# Patient Record
Sex: Female | Born: 1935 | Race: White | Hispanic: No | Smoking: Never smoker
Health system: Southern US, Community
[De-identification: ages and names within clinical notes are randomized; demographics above are authoritative.]

## PROBLEM LIST (undated history)

## (undated) DIAGNOSIS — K449 Diaphragmatic hernia without obstruction or gangrene: Secondary | ICD-10-CM

## (undated) DIAGNOSIS — M4800 Spinal stenosis, site unspecified: Secondary | ICD-10-CM

## (undated) DIAGNOSIS — I10 Essential (primary) hypertension: Secondary | ICD-10-CM

## (undated) DIAGNOSIS — S12100A Unspecified displaced fracture of second cervical vertebra, initial encounter for closed fracture: Secondary | ICD-10-CM

## (undated) HISTORY — PX: APPENDECTOMY: SHX54

## (undated) HISTORY — PX: HERNIA REPAIR: SHX51

## (undated) HISTORY — PX: ARTHROPLASTY: SHX135

## (undated) HISTORY — PX: OTHER SURGICAL HISTORY: SHX169

## (undated) HISTORY — PX: CAROTID ENDARTERECTOMY: SUR193

## (undated) HISTORY — PX: TONSILLECTOMY: SUR1361

## (undated) HISTORY — PX: CHOLECYSTECTOMY: SHX55

## (undated) HISTORY — PX: BACK SURGERY: SHX140

---

## 2015-05-05 ENCOUNTER — Encounter (HOSPITAL_COMMUNITY): Payer: Self-pay | Admitting: Emergency Medicine

## 2015-05-05 ENCOUNTER — Emergency Department (HOSPITAL_COMMUNITY): Payer: Medicare Other

## 2015-05-05 ENCOUNTER — Emergency Department (HOSPITAL_COMMUNITY)
Admission: EM | Admit: 2015-05-05 | Discharge: 2015-05-05 | Disposition: A | Discharge status: Home / Self Care | Payer: Medicare Other | Attending: Emergency Medicine | Admitting: Emergency Medicine

## 2015-05-05 DIAGNOSIS — S0990XA Unspecified injury of head, initial encounter: Secondary | ICD-10-CM | POA: Diagnosis not present

## 2015-05-05 DIAGNOSIS — S79911A Unspecified injury of right hip, initial encounter: Secondary | ICD-10-CM | POA: Diagnosis not present

## 2015-05-05 DIAGNOSIS — S8011XA Contusion of right lower leg, initial encounter: Secondary | ICD-10-CM

## 2015-05-05 DIAGNOSIS — Y998 Other external cause status: Secondary | ICD-10-CM | POA: Insufficient documentation

## 2015-05-05 DIAGNOSIS — S0992XA Unspecified injury of nose, initial encounter: Secondary | ICD-10-CM | POA: Diagnosis present

## 2015-05-05 DIAGNOSIS — S1091XA Abrasion of unspecified part of neck, initial encounter: Secondary | ICD-10-CM | POA: Diagnosis not present

## 2015-05-05 DIAGNOSIS — W19XXXA Unspecified fall, initial encounter: Secondary | ICD-10-CM

## 2015-05-05 DIAGNOSIS — Z7982 Long term (current) use of aspirin: Secondary | ICD-10-CM | POA: Insufficient documentation

## 2015-05-05 DIAGNOSIS — S199XXA Unspecified injury of neck, initial encounter: Secondary | ICD-10-CM | POA: Diagnosis not present

## 2015-05-05 DIAGNOSIS — T148XXA Other injury of unspecified body region, initial encounter: Secondary | ICD-10-CM

## 2015-05-05 DIAGNOSIS — R51 Headache: Secondary | ICD-10-CM | POA: Diagnosis not present

## 2015-05-05 DIAGNOSIS — Y92129 Unspecified place in nursing home as the place of occurrence of the external cause: Secondary | ICD-10-CM | POA: Diagnosis not present

## 2015-05-05 DIAGNOSIS — W01198A Fall on same level from slipping, tripping and stumbling with subsequent striking against other object, initial encounter: Secondary | ICD-10-CM | POA: Insufficient documentation

## 2015-05-05 DIAGNOSIS — Y9389 Activity, other specified: Secondary | ICD-10-CM | POA: Insufficient documentation

## 2015-05-05 DIAGNOSIS — S022XXA Fracture of nasal bones, initial encounter for closed fracture: Secondary | ICD-10-CM

## 2015-05-05 DIAGNOSIS — Z8781 Personal history of (healed) traumatic fracture: Secondary | ICD-10-CM | POA: Diagnosis not present

## 2015-05-05 DIAGNOSIS — M4800 Spinal stenosis, site unspecified: Secondary | ICD-10-CM | POA: Diagnosis not present

## 2015-05-05 DIAGNOSIS — M25551 Pain in right hip: Secondary | ICD-10-CM

## 2015-05-05 DIAGNOSIS — I10 Essential (primary) hypertension: Secondary | ICD-10-CM | POA: Insufficient documentation

## 2015-05-05 DIAGNOSIS — Z79899 Other long term (current) drug therapy: Secondary | ICD-10-CM | POA: Insufficient documentation

## 2015-05-05 HISTORY — DX: Spinal stenosis, site unspecified: M48.00

## 2015-05-05 HISTORY — DX: Diaphragmatic hernia without obstruction or gangrene: K44.9

## 2015-05-05 HISTORY — DX: Unspecified displaced fracture of second cervical vertebra, initial encounter for closed fracture: S12.100A

## 2015-05-05 HISTORY — DX: Essential (primary) hypertension: I10

## 2015-05-05 LAB — BASIC METABOLIC PANEL
Anion gap: 9 (ref 5–15)
BUN: 25 mg/dL — AB (ref 6–20)
CO2: 28 mmol/L (ref 22–32)
Calcium: 9.7 mg/dL (ref 8.9–10.3)
Chloride: 102 mmol/L (ref 101–111)
Creatinine, Ser: 1.03 mg/dL — ABNORMAL HIGH (ref 0.44–1.00)
GFR calc Af Amer: 53 mL/min — ABNORMAL LOW (ref >60)
GFR, EST NON AFRICAN AMERICAN: 46 mL/min — AB (ref >60)
GLUCOSE: 115 mg/dL — AB (ref 65–99)
POTASSIUM: 4.1 mmol/L (ref 3.5–5.1)
Sodium: 139 mmol/L (ref 135–145)

## 2015-05-05 LAB — CBC WITH DIFFERENTIAL/PLATELET
BASOS ABS: 0 10*3/uL (ref 0.0–0.1)
BASOS PCT: 0 % (ref 0–1)
Eosinophils Absolute: 0.3 10*3/uL (ref 0.0–0.7)
Eosinophils Relative: 6 % — ABNORMAL HIGH (ref 0–5)
HEMATOCRIT: 33.3 % — AB (ref 36.0–46.0)
HEMOGLOBIN: 11 g/dL — AB (ref 12.0–15.0)
Lymphocytes Relative: 27 % (ref 12–46)
Lymphs Abs: 1.4 10*3/uL (ref 0.7–4.0)
MCH: 33.2 pg (ref 26.0–34.0)
MCHC: 33 g/dL (ref 30.0–36.0)
MCV: 100.6 fL — AB (ref 78.0–100.0)
MONO ABS: 0.4 10*3/uL (ref 0.1–1.0)
Monocytes Relative: 8 % (ref 3–12)
NEUTROS ABS: 3.2 10*3/uL (ref 1.7–7.7)
Neutrophils Relative %: 59 % (ref 43–77)
Platelets: 164 10*3/uL (ref 150–400)
RBC: 3.31 MIL/uL — ABNORMAL LOW (ref 3.87–5.11)
RDW: 15.5 % (ref 11.5–15.5)
WBC: 5.4 10*3/uL (ref 4.0–10.5)

## 2015-05-05 MED ORDER — ACETAMINOPHEN 325 MG PO TABS: 650.0000 mg | ORAL_TABLET | Freq: Once | ORAL | Status: DC

## 2015-05-05 NOTE — ED Notes (Signed)
Patient from [facility].  Patient got up watch some TV, slipped and fell forward and hit head on floor.  No LOC, full recall, CAOx4.  Patient does have more pain in right shoulder than usual.  Rug burn and bruising noted to upper bilateral sides of nose bridge.    Bleeding controlled.  Patient has history of C2 FX 2014.  Patient had Aspen collar at home and EMS placed patient in Aspen collar for transport.

## 2015-05-05 NOTE — ED Notes (Signed)
Dr Zavitz at bedside  

## 2015-05-05 NOTE — ED Provider Notes (Signed)
81 year old female signed out to me at shift change by Arley Phenix M.D. pending ambulation. Patient fell early in the day she was found to have nasal fractures, no other significant findings. Patient was given food, she tolerated this well. She was able to ambulate without difficulty or pain. Family present at the time of evaluation, reports that they would be taking her home with them. Both the patient, and her family were in agreement today's plan and had no further concerns at the time of discharge. Strict return precautions given,.  Eyvonne Mechanic, PA-C 05/05/15 1651  Blane Ohara, MD 05/07/15 [phone]

## 2015-05-05 NOTE — Discharge Instructions (Signed)
If you were given medicines take as directed.  If you are on coumadin or contraceptives realize their levels and effectiveness is altered by many different medicines.  If you have any reaction (rash, tongues swelling, other) to the medicines stop taking and see a physician.    If your blood pressure was elevated in the ER make sure you follow up for management with a primary doctor or return for chest pain, shortness of breath or stroke symptoms.  Please follow up as directed and return to the ER or see a physician for new or worsening symptoms.  Thank you. Filed Vitals:   05/05/15 0530 05/05/15 0539 05/05/15 0545 05/05/15 0559  BP: 177/48 177/48 169/49   Pulse: 62 60 62 64  Temp:      TempSrc:      Resp:  12    SpO2: 96% 95% 94% 96%

## 2015-05-05 NOTE — ED Provider Notes (Signed)
CSN: [id number]     Arrival date & time 05/05/15  0416 History   First MD Initiated Contact with Patient 05/05/15 [phone]     Chief Complaint  Patient presents with  . Fall     (Consider location/radiation/quality/duration/timing/severity/associated sxs/prior Treatment) HPI Comments: 81 year old female with history of carotid stenosis, high blood pressure, C2 cervical fracture presents from assisted living with family after she slipped and tripped with her walker hitting the front or head on the floor. No loss of consciousness, patient recalled all events. Patient has mild discomfort but no focal tenderness except for right proximal tibia and nose. Bleeding controlled. Patient on aspirin. Patient aspirin Kollar from home that she had from her previous fracture. Patient's son-in-law is orthopedic spine surgeon in the room to assist.  Patient is a 81 y.o. female presenting with fall. The history is provided by the patient.  Fall Associated symptoms include headaches. Pertinent negatives include no chest pain, no abdominal pain and no shortness of breath.    Past Medical History  Diagnosis Date  . Hypertension   . C2 cervical fracture   . Hiatal hernia   . Spinal stenosis    Past Surgical History  Procedure Laterality Date  . Cholecystectomy    . Tonsillectomy    . Carotid endarterectomy      bilateral  . Renal ateral stents    . Hernia repair      inginual and umbilical  . Appendectomy    . Back surgery    . Arthroplasty      bilateral knee   History reviewed. No pertinent family history. History  Substance Use Topics  . Smoking status: Never Smoker   . Smokeless tobacco: Not on file  . Alcohol Use: No   OB History    No data available     Review of Systems  Constitutional: Negative for fever and chills.  HENT: Negative for congestion.   Eyes: Negative for visual disturbance.  Respiratory: Negative for shortness of breath.   Cardiovascular: Negative for chest pain.   Gastrointestinal: Negative for vomiting and abdominal pain.  Genitourinary: Negative for dysuria and flank pain.  Musculoskeletal: Positive for arthralgias and neck pain. Negative for back pain and neck stiffness.  Skin: Positive for wound. Negative for rash.  Neurological: Positive for headaches. Negative for light-headedness.      Allergies  Macrodantin  Home Medications   Prior to Admission medications   Medication Sig Start Date End Date Taking? Authorizing Provider  acetaminophen (TYLENOL) 325 MG tablet Take 325 mg by mouth every 6 (six) hours as needed (1-2 tablets as needed).   Yes Historical Provider, MD  alendronate (FOSAMAX) 70 MG tablet Take 70 mg by mouth once a week. Take with a full glass of water on an empty stomach.   Yes Historical Provider, MD  aspirin 81 MG tablet Take 81 mg by mouth daily.   Yes Historical Provider, MD  atorvastatin (LIPITOR) 20 MG tablet Take 20 mg by mouth daily.   Yes Historical Provider, MD  baclofen (LIORESAL) 10 MG tablet Take 5 mg by mouth 3 (three) times daily.   Yes Historical Provider, MD  diltiazem (CARDIZEM) 120 MG tablet Take 240 mg by mouth once.   Yes Historical Provider, MD  fluticasone (FLONASE) 50 MCG/ACT nasal spray Place 1 spray into both nostrils daily as needed for allergies or rhinitis.   Yes Historical Provider, MD  furosemide (LASIX) 20 MG tablet Take 30 mg by mouth daily.   Yes Historical  Provider, MD  loratadine (CLARITIN) 10 MG tablet Take 10 mg by mouth daily.   Yes Historical Provider, MD  losartan (COZAAR) 100 MG tablet Take 100 mg by mouth daily.   Yes Historical Provider, MD  Multiple Vitamins-Minerals (MULTIVITAMIN WITH MINERALS) tablet Take 1 tablet by mouth daily.   Yes Historical Provider, MD  omeprazole (PRILOSEC) 20 MG capsule Take 20 mg by mouth 2 (two) times daily before a meal.   Yes Historical Provider, MD  ondansetron (ZOFRAN) 4 MG tablet Take 4 mg by mouth every 8 (eight) hours as needed for nausea or  vomiting.   Yes Historical Provider, MD  polyethylene glycol (MIRALAX / GLYCOLAX) packet Take 17 g by mouth daily as needed.   Yes Historical Provider, MD  potassium chloride (K-DUR,KLOR-CON) 10 MEQ tablet Take 10 mEq by mouth once.   Yes Historical Provider, MD  rOPINIRole (REQUIP) 1 MG tablet Take 0.5 mg by mouth 4 (four) times daily.   Yes Historical Provider, MD  tiotropium (SPIRIVA) 18 MCG inhalation capsule Place 18 mcg into inhaler and inhale once.   Yes Historical Provider, MD  triamcinolone ointment (KENALOG) 0.1 % Apply 1 application topically 2 (two) times daily as needed.   Yes Historical Provider, MD  Vitamin D, Ergocalciferol, (DRISDOL) 50000 UNITS CAPS capsule Take 1,000 Units by mouth once.   Yes Historical Provider, MD   BP 181/53 mmHg  Pulse 66  Temp(Src) 97.7 F (36.5 C) (Oral)  Resp 21  SpO2 95% Physical Exam  Constitutional: She is oriented to person, place, and time. She appears well-developed and well-nourished.  HENT:  Head: Normocephalic.  Patient has swelling nasal bridge, superficial abrasion and superficial laceration right lateral neck. Neck supple no midline tenderness, c-collar in place.  Eyes: Conjunctivae are normal. Right eye exhibits no discharge. Left eye exhibits no discharge.  Neck: Normal range of motion. Neck supple. No tracheal deviation present.  Cardiovascular: Normal rate and regular rhythm.   Pulmonary/Chest: Effort normal and breath sounds normal.  Abdominal: Soft. She exhibits no distension. There is no tenderness. There is no guarding.  Musculoskeletal: She exhibits no edema.  Patient has tenderness proximal tibia and midline lower lumbar.  Neurological: She is alert and oriented to person, place, and time.  Skin: Skin is warm. No rash noted.  Psychiatric: She has a normal mood and affect.  Nursing note and vitals reviewed.   ED Course  Procedures (including critical care time) Labs Review Labs Reviewed  CBC WITH  DIFFERENTIAL/PLATELET - Abnormal; Notable for the following:    RBC 3.31 (*)    Hemoglobin 11.0 (*)    HCT 33.3 (*)    MCV 100.6 (*)    Eosinophils Relative 6 (*)    All other components within normal limits  BASIC METABOLIC PANEL    Imaging Review Dg Thoracic Spine 2 View  05/05/2015   CLINICAL DATA:  81 year old female with history of trauma from a fall. Back pain.  EXAM: THORACIC SPINE 2 VIEWS  COMPARISON:  No priors.  FINDINGS: Three views of the thoracic spine demonstrate no definite acute displaced fractures or acute compression type fractures. Radiopaque marker posterior to T7. There is extensive multilevel degenerative disc disease throughout the thoracic spine, with associated degenerative changes of the vertebral body endplates. Mild levoscoliosis centered at the level of T12. Extensive atherosclerosis. Surgical clips project over the right upper quadrant of the abdomen, potentially from prior cholecystectomy.  IMPRESSION: 1. No radiographic evidence of significant acute traumatic injury to the thoracic spine.  Electronically Signed   By: Trudie Reed M.D.   On: 05/05/2015 07:15   Dg Lumbar Spine Complete  05/05/2015   CLINICAL DATA:  Slipped and fell at assisted living facility. Lumbar spine pain  EXAM: LUMBAR SPINE - COMPLETE 4+ VIEW  COMPARISON:  None.  FINDINGS: Iliac stent noted. Mild dextroscoliosis. Heavy calcification of the aorta. No acute fractures identified. Advanced degenerative change involving the facet joints throughout the entire lumbar spine. As a result there is mild listhesis of most vertebral bodies. There is moderate to severe multilevel degenerative disc disease throughout.  IMPRESSION: Severe degenerative change with no acute findings   Electronically Signed   By: Esperanza Heir M.D.   On: 05/05/2015 07:14   Dg Tibia/fibula Right  05/05/2015   CLINICAL DATA:  Slipped and fell at assisted living facility, right leg pain  EXAM: RIGHT TIBIA AND FIBULA - 2 VIEW   COMPARISON:  None.  FINDINGS: Diffuse osteopenia.  Knee replacement.  No fracture or dislocation.  IMPRESSION: No acute findings   Electronically Signed   By: Esperanza Heir M.D.   On: 05/05/2015 07:13   Ct Head Wo Contrast  05/05/2015   CLINICAL DATA:  Larey Seat on face  EXAM: CT HEAD WITHOUT CONTRAST  CT MAXILLOFACIAL WITHOUT CONTRAST  CT CERVICAL SPINE WITHOUT CONTRAST  TECHNIQUE: Multidetector CT imaging of the head, cervical spine, and maxillofacial structures were performed using the standard protocol without intravenous contrast. Multiplanar CT image reconstructions of the cervical spine and maxillofacial structures were also generated.  COMPARISON:  None.  FINDINGS: CT HEAD FINDINGS  There is no intracranial hemorrhage or extra-axial fluid collection. There is moderate generalized atrophy. There is mild white matter hypodensity consistent with chronic small vessel disease. There is a remote lacunar infarction in the left caudate. No acute intracranial findings are evident. Calvarium and skullbase are intact.  CT MAXILLOFACIAL FINDINGS  There are slightly depressed fracture deformities of the nasal bones, possibly acute as there does appear to be overlying soft tissue swelling. No other acute maxillofacial fracture is evident. There is chronic opacification of the left maxillary sinus. Zygomatic arches and pterygoid plates are intact. Orbital floors are intact. Mandible and TMJ are intact.  CT CERVICAL SPINE FINDINGS  There is mild irregularity at the base of the odontoid which likely represents old fracture which now is solidly ossified. No acute cervical spine fracture is evident. There is interbody fusion at C5-6, likely congenital. Moderately severe degenerative disc changes are present, greatest at C4-5 where there probably is a degenerative central canal stenosis due to posterior osteophyte. There is no bone lesion or bony destruction.  IMPRESSION: 1. Negative for acute intracranial traumatic injury. 2.  Moderate generalized cerebral atrophy and mild chronic small vessel ischemic disease. Remote left caudate lacunar infarction. No acute intracranial findings. 3. Fracture deformities of the nasal bones, probably acute as there is overlying soft tissue swelling. No other maxillofacial fractures. 4. Chronic opacification of the left maxillary sinus. 5. Healed fracture at the base of the odontoid with solid bony union. 6. Negative for acute cervical spine fracture. 7. Moderately severe degenerative disc changes of the cervical spine, greatest at C4-5 where there probably is a degenerative central canal stenosis.   Electronically Signed   By: Ellery Plunk M.D.   On: 05/05/2015 06:42   Ct Cervical Spine Wo Contrast  05/05/2015   CLINICAL DATA:  Larey Seat on face  EXAM: CT HEAD WITHOUT CONTRAST  CT MAXILLOFACIAL WITHOUT CONTRAST  CT CERVICAL SPINE WITHOUT CONTRAST  TECHNIQUE: Multidetector CT imaging of the head, cervical spine, and maxillofacial structures were performed using the standard protocol without intravenous contrast. Multiplanar CT image reconstructions of the cervical spine and maxillofacial structures were also generated.  COMPARISON:  None.  FINDINGS: CT HEAD FINDINGS  There is no intracranial hemorrhage or extra-axial fluid collection. There is moderate generalized atrophy. There is mild white matter hypodensity consistent with chronic small vessel disease. There is a remote lacunar infarction in the left caudate. No acute intracranial findings are evident. Calvarium and skullbase are intact.  CT MAXILLOFACIAL FINDINGS  There are slightly depressed fracture deformities of the nasal bones, possibly acute as there does appear to be overlying soft tissue swelling. No other acute maxillofacial fracture is evident. There is chronic opacification of the left maxillary sinus. Zygomatic arches and pterygoid plates are intact. Orbital floors are intact. Mandible and TMJ are intact.  CT CERVICAL SPINE FINDINGS   There is mild irregularity at the base of the odontoid which likely represents old fracture which now is solidly ossified. No acute cervical spine fracture is evident. There is interbody fusion at C5-6, likely congenital. Moderately severe degenerative disc changes are present, greatest at C4-5 where there probably is a degenerative central canal stenosis due to posterior osteophyte. There is no bone lesion or bony destruction.  IMPRESSION: 1. Negative for acute intracranial traumatic injury. 2. Moderate generalized cerebral atrophy and mild chronic small vessel ischemic disease. Remote left caudate lacunar infarction. No acute intracranial findings. 3. Fracture deformities of the nasal bones, probably acute as there is overlying soft tissue swelling. No other maxillofacial fractures. 4. Chronic opacification of the left maxillary sinus. 5. Healed fracture at the base of the odontoid with solid bony union. 6. Negative for acute cervical spine fracture. 7. Moderately severe degenerative disc changes of the cervical spine, greatest at C4-5 where there probably is a degenerative central canal stenosis.   Electronically Signed   By: Ellery Plunk M.D.   On: 05/05/2015 06:42   Dg Hip Unilat With Pelvis 2-3 Views Right  05/05/2015   CLINICAL DATA:  Patient slipped and fell at assisted living facility with subsequent right hip pain  EXAM: DG HIP (WITH OR WITHOUT PELVIS) 2-3V RIGHT  COMPARISON:  None.  FINDINGS: Pelvic bones are intact.  No fracture or dislocation.  IMPRESSION: No acute findings   Electronically Signed   By: Esperanza Heir M.D.   On: 05/05/2015 07:15   Ct Maxillofacial Wo Cm  05/05/2015   CLINICAL DATA:  Larey Seat on face  EXAM: CT HEAD WITHOUT CONTRAST  CT MAXILLOFACIAL WITHOUT CONTRAST  CT CERVICAL SPINE WITHOUT CONTRAST  TECHNIQUE: Multidetector CT imaging of the head, cervical spine, and maxillofacial structures were performed using the standard protocol without intravenous contrast. Multiplanar CT  image reconstructions of the cervical spine and maxillofacial structures were also generated.  COMPARISON:  None.  FINDINGS: CT HEAD FINDINGS  There is no intracranial hemorrhage or extra-axial fluid collection. There is moderate generalized atrophy. There is mild white matter hypodensity consistent with chronic small vessel disease. There is a remote lacunar infarction in the left caudate. No acute intracranial findings are evident. Calvarium and skullbase are intact.  CT MAXILLOFACIAL FINDINGS  There are slightly depressed fracture deformities of the nasal bones, possibly acute as there does appear to be overlying soft tissue swelling. No other acute maxillofacial fracture is evident. There is chronic opacification of the left maxillary sinus. Zygomatic arches and pterygoid plates are intact. Orbital floors are intact. Mandible and TMJ  are intact.  CT CERVICAL SPINE FINDINGS  There is mild irregularity at the base of the odontoid which likely represents old fracture which now is solidly ossified. No acute cervical spine fracture is evident. There is interbody fusion at C5-6, likely congenital. Moderately severe degenerative disc changes are present, greatest at C4-5 where there probably is a degenerative central canal stenosis due to posterior osteophyte. There is no bone lesion or bony destruction.  IMPRESSION: 1. Negative for acute intracranial traumatic injury. 2. Moderate generalized cerebral atrophy and mild chronic small vessel ischemic disease. Remote left caudate lacunar infarction. No acute intracranial findings. 3. Fracture deformities of the nasal bones, probably acute as there is overlying soft tissue swelling. No other maxillofacial fractures. 4. Chronic opacification of the left maxillary sinus. 5. Healed fracture at the base of the odontoid with solid bony union. 6. Negative for acute cervical spine fracture. 7. Moderately severe degenerative disc changes of the cervical spine, greatest at C4-5  where there probably is a degenerative central canal stenosis.   Electronically Signed   By: Ellery Plunk M.D.   On: 05/05/2015 06:42     EKG Interpretation None      MDM   Final diagnoses:  Right hip pain  Fall, initial encounter  Contusion of right leg, initial encounter  Skin abrasion  Nasal bone fracture, closed, initial encounter   Patient presented after mechanical fall from the assisted living. Patient multiple injuries on exam. CT scan results reviewed significant degeneration no acute fracture. X-rays reviewed no acute fracture. Delay in blood work and EKG as patient was in imaging. Blood work pending. Patient feels overall okay. C collar removed. Plan for oral fluids and ambulation, if patient does well she can follow-up outpatient if difficulties plan for observation. Updated family who agreed with plan.  Results and differential diagnosis were discussed with the patient/parent/guardian. Xrays were independently reviewed by myself.  Close follow up outpatient was discussed, comfortable with the plan.   Medications  acetaminophen (TYLENOL) tablet 650 mg (650 mg Oral Not Given 05/05/15 0747)    Filed Vitals:   05/05/15 0545 05/05/15 0559 05/05/15 0730 05/05/15 0745  BP: 169/49  190/60 181/53  Pulse: 62 64 65 66  Temp:      TempSrc:      Resp:   21 21  SpO2: 94% 96% 96% 95%    Final diagnoses:  Right hip pain  Fall, initial encounter  Contusion of right leg, initial encounter  Skin abrasion  Nasal bone fracture, closed, initial encounter       Blane Ohara, MD 05/07/15 2253

## 2015-05-05 NOTE — ED Notes (Signed)
Pt still with radiology, daughter at bedside

## 2016-08-28 IMAGING — CT CT CERVICAL SPINE W/O CM
5 of 8 series · 12 of 33 positions shown, 13 images · non-contrast
Comparison: None.

CLINICAL DATA: Fell on face

EXAM:
CT HEAD WITHOUT CONTRAST
CT MAXILLOFACIAL WITHOUT CONTRAST
CT CERVICAL SPINE WITHOUT CONTRAST
TECHNIQUE: Multidetector CT imaging of the head, cervical spine, and
maxillofacial structures were performed using the standard protocol
without intravenous contrast. Multiplanar CT image reconstructions
of the cervical spine and maxillofacial structures were also
generated.

[Series 5: facial/ orbits 2.0 h30s · axial · 0.32mm/px · z∈[-110,-58]mm · 2 of 80 slices shown, 3 images]
[im 27/80  soft-tissue]
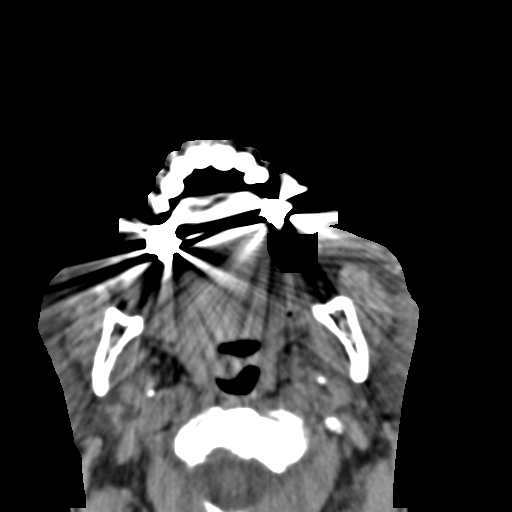
[im 27/80  bone]
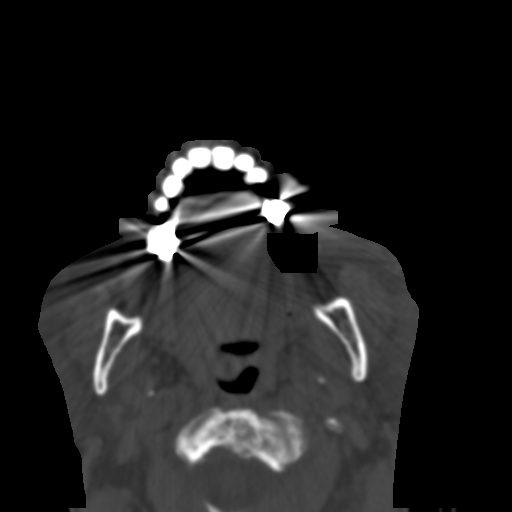
[im 53/80  bone]
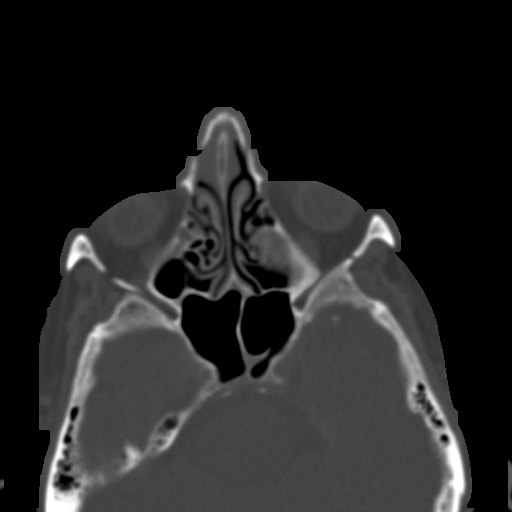

[Series 9: coronal soft tissue · coronal · 0.33mm/px · 2 of 73 slices shown]
[im 25/73  bone]
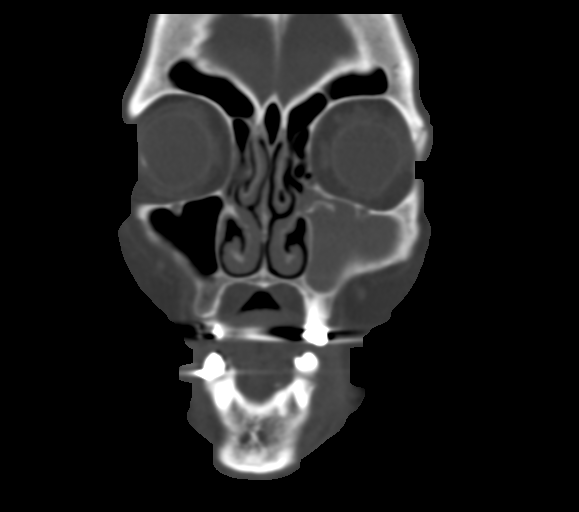
[im 49/73  bone]
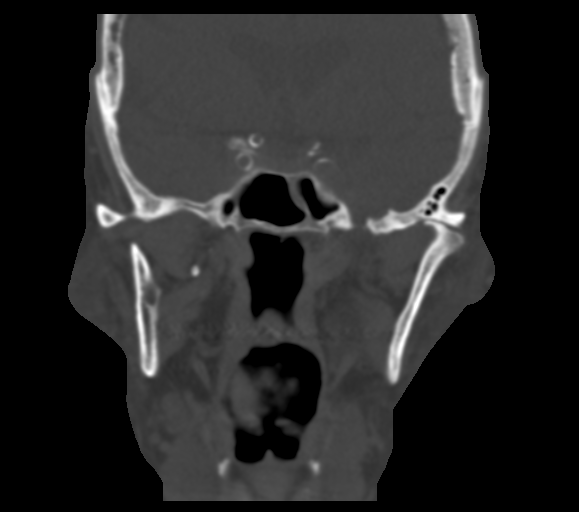

[Series 14: c_spine 2.0 i40s 3 · axial · 0.34mm/px · z∈[-174,-114]mm · 2 of 91 slices shown]
[im 31/91  bone]
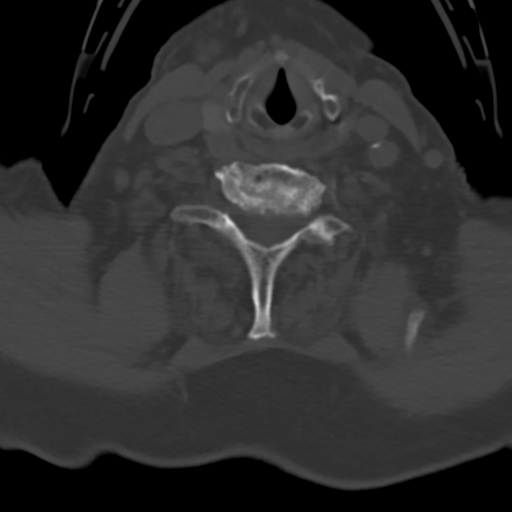
[im 61/91  bone]
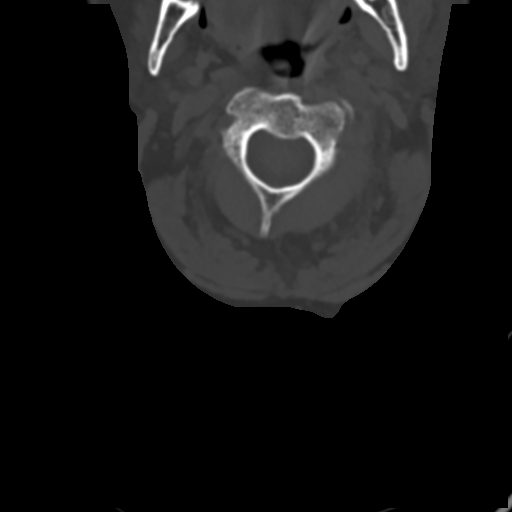

[Series 17: sagittals · sagittal · 0.26mm/px · 4 of 61 slices shown]
[im 13/61  bone]
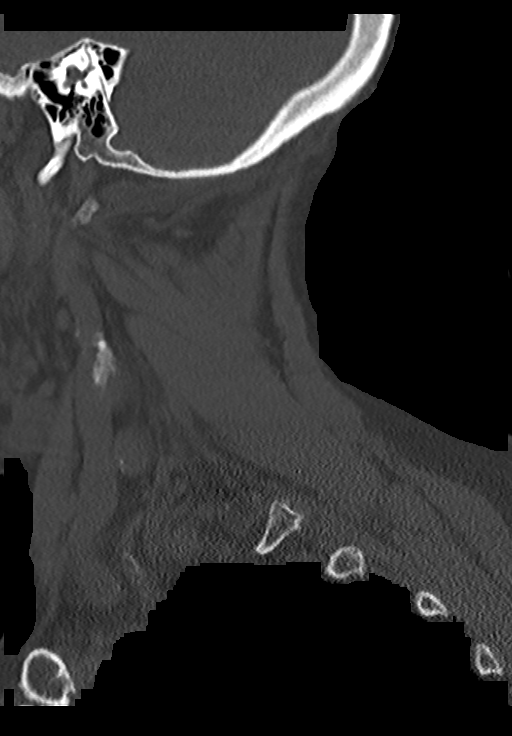
[im 25/61  bone]
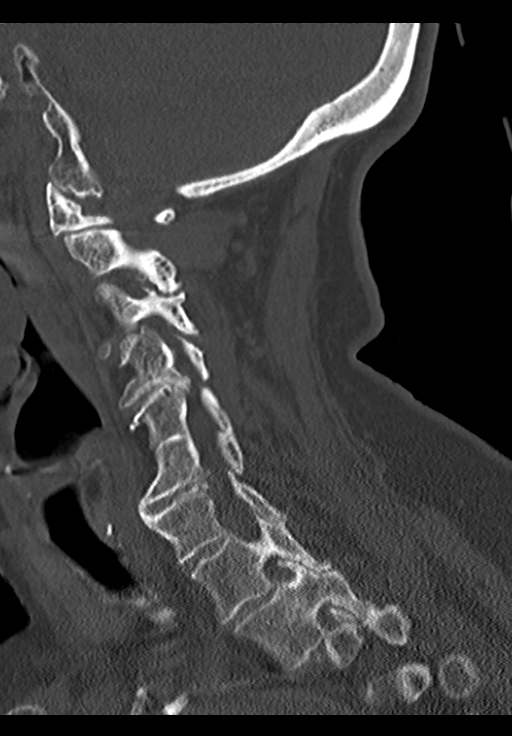
[im 37/61  bone]
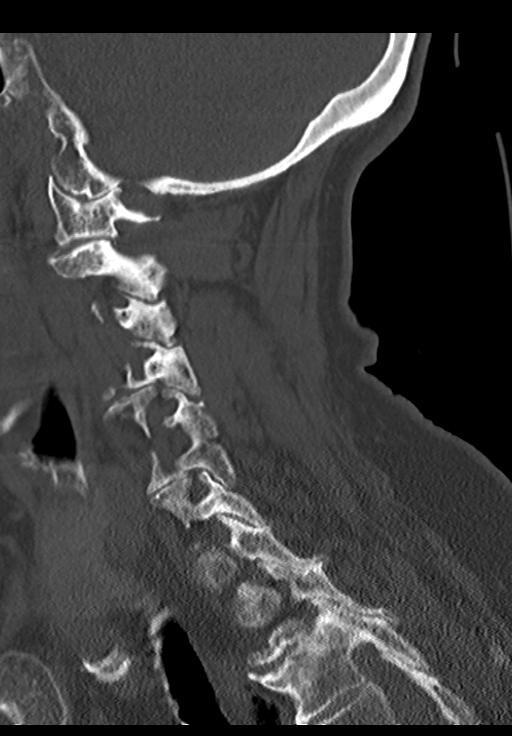
[im 49/61  bone]
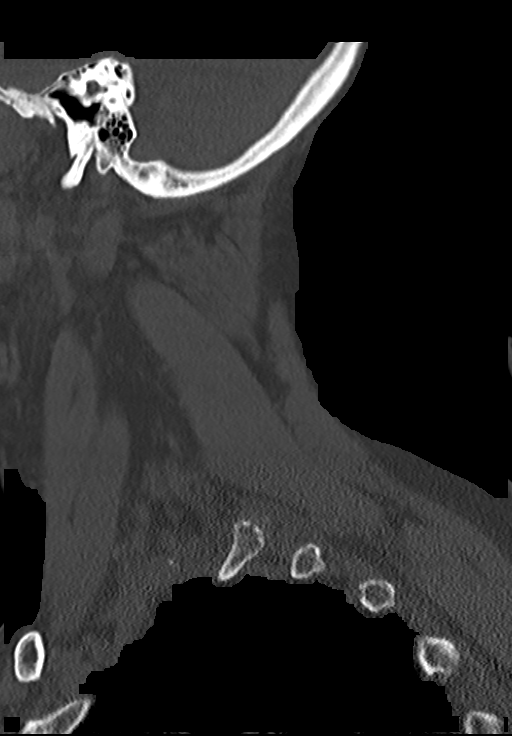

[Series 18: orthogonals · axial · 0.23mm/px · z∈[-201,-148]mm · 2 of 87 slices shown]
[im 29/87  bone]
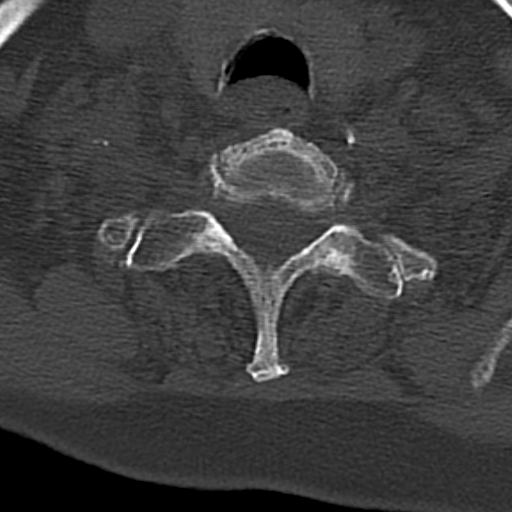
[im 58/87  bone]
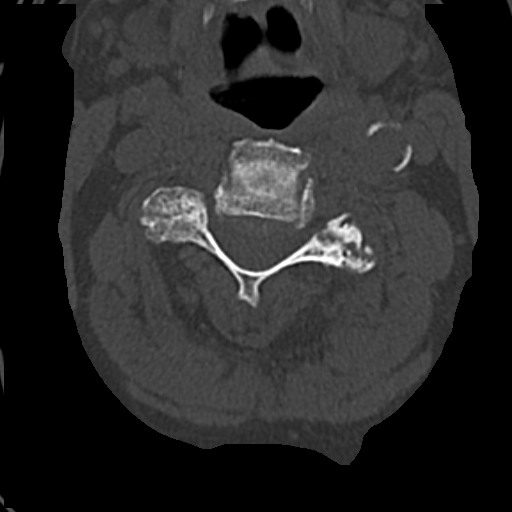

[12 of 33 positions shown; findings below may reference images not displayed]

FINDINGS: CT HEAD FINDINGS

There is no intracranial hemorrhage or extra-axial fluid collection.
There is moderate generalized atrophy. There is mild white matter
hypodensity consistent with chronic small vessel disease. There is a
remote lacunar infarction in the left caudate. No acute intracranial
findings are evident. Calvarium and skullbase are intact.

CT MAXILLOFACIAL FINDINGS

There are slightly depressed fracture deformities of the nasal
bones, possibly acute as there does appear to be overlying soft
tissue swelling. No other acute maxillofacial fracture is evident.
There is chronic opacification of the left maxillary sinus.
Zygomatic arches and pterygoid plates are intact. Orbital floors are
intact. Mandible and TMJ are intact.

CT CERVICAL SPINE FINDINGS

There is mild irregularity at the base of the odontoid which likely
represents old fracture which now is solidly ossified. No acute
cervical spine fracture is evident. There is interbody fusion at
C5-6, likely congenital. Moderately severe degenerative disc changes
are present, greatest at C4-5 where there probably is a degenerative
central canal stenosis due to posterior osteophyte. There is no bone
lesion or bony destruction.
IMPRESSION: 1. Negative for acute intracranial traumatic injury.
2. Moderate generalized cerebral atrophy and mild chronic small
vessel ischemic disease. Remote left caudate lacunar infarction. No
acute intracranial findings.
3. Fracture deformities of the nasal bones, probably acute as there
is overlying soft tissue swelling. No other maxillofacial fractures.
4. Chronic opacification of the left maxillary sinus.
5. Healed fracture at the base of the odontoid with solid bony
union.
6. Negative for acute cervical spine fracture.
7. Moderately severe degenerative disc changes of the cervical
spine, greatest at C4-5 where there probably is a degenerative
central canal stenosis.

## 2018-07-15 ENCOUNTER — Encounter (HOSPITAL_COMMUNITY): Payer: Self-pay

## 2018-07-15 ENCOUNTER — Other Ambulatory Visit: Payer: Self-pay

## 2018-07-15 ENCOUNTER — Emergency Department (HOSPITAL_COMMUNITY)
Admission: EM | Admit: 2018-07-15 | Discharge: 2018-07-16 | Disposition: A | Discharge status: Nursing Home (Includes ICF) | Payer: Medicare Other | Attending: Emergency Medicine | Admitting: Emergency Medicine

## 2018-07-15 ENCOUNTER — Emergency Department (HOSPITAL_COMMUNITY): Payer: Medicare Other

## 2018-07-15 DIAGNOSIS — M542 Cervicalgia: Secondary | ICD-10-CM | POA: Diagnosis present

## 2018-07-15 DIAGNOSIS — I10 Essential (primary) hypertension: Secondary | ICD-10-CM | POA: Insufficient documentation

## 2018-07-15 DIAGNOSIS — Z79899 Other long term (current) drug therapy: Secondary | ICD-10-CM | POA: Diagnosis not present

## 2018-07-15 DIAGNOSIS — M5030 Other cervical disc degeneration, unspecified cervical region: Secondary | ICD-10-CM

## 2018-07-15 MED ORDER — ACETAMINOPHEN 325 MG PO TABS
650.0000 mg | ORAL_TABLET | Freq: Once | ORAL | Status: AC
Start: 2018-07-15 — End: 2018-07-16
  Administered 2018-07-16: 650 mg via ORAL
  Filled 2018-07-15: qty 2

## 2018-07-15 MED ORDER — IBUPROFEN 200 MG PO TABS
400.0000 mg | ORAL_TABLET | Freq: Once | ORAL | Status: AC
  Administered 2018-07-16: 400 mg via ORAL
  Filled 2018-07-15: qty 2

## 2018-07-15 MED ORDER — LIDOCAINE 5 % EX PTCH
1.0000 | MEDICATED_PATCH | CUTANEOUS | Status: DC
  Administered 2018-07-16: 1 via TRANSDERMAL
  Filled 2018-07-15: qty 1

## 2018-07-15 NOTE — ED Notes (Signed)
Bed: WA01 Expected date:  Expected time:  Means of arrival:  Comments: 40F chronic neck pain

## 2018-07-15 NOTE — ED Triage Notes (Signed)
Pt arrived from [facility]  ALF. Pt is c/o neck pain x 1 day, pt reports that she feel asleep on the couch  And awoke and felt pain that worsens with movement of  her neck. Pt reports to EMS that she has a HX of a neck Fx in the past, Pt reports that she took tylenol for the pain and was not effective in pain management.   EMS v/s 170/60 HR 88, RR 16, 96%

## 2018-07-16 DIAGNOSIS — M5030 Other cervical disc degeneration, unspecified cervical region: Secondary | ICD-10-CM | POA: Diagnosis not present

## 2018-07-16 MED ORDER — LIDOCAINE 4 % EX PTCH: 1.0000 | MEDICATED_PATCH | Freq: Two times a day (BID) | CUTANEOUS | 0 refills | Status: AC

## 2018-07-16 MED ORDER — ACETAMINOPHEN 325 MG PO TABS
650.0000 mg | ORAL_TABLET | Freq: Once | ORAL | Status: AC
  Administered 2018-07-16: 650 mg via ORAL
  Filled 2018-07-16: qty 2

## 2018-07-16 NOTE — ED Provider Notes (Signed)
[facility]-EMERGENCY DEPT Provider Note   CSN: [id number] Arrival date & time: 07/15/18  2151     History   Chief Complaint Chief Complaint  Patient presents with  . Neck Pain    HPI Holly Mann is a 84 y.o. female.  HPI  84 year old female comes in with chief complaint of neck pain.  She has history of hiatal hernia, spinal stenosis and C2 cervical spine fracture.  Patient reports that she has had neck pain over the past few days that has gotten worse.  This evening after she woke up from her nap, she noted severe pain in her neck which is worse with any movement.  Patient denies any associated numbness, tingling, weakness, vision changes, dizziness, slurring of the speech.  There is also no chest pain or shortness of breath.  Patient describes the pain as a dull but constant pain that is worse with movement.  Her pain is worse on the left side.  There is no history of fall.  Past Medical History:  Diagnosis Date  . C2 cervical fracture (HCC)   . Hiatal hernia   . Hypertension   . Spinal stenosis     There are no active problems to display for this patient.   Past Surgical History:  Procedure Laterality Date  . APPENDECTOMY    . ARTHROPLASTY     bilateral knee  . BACK SURGERY    . CAROTID ENDARTERECTOMY     bilateral  . CHOLECYSTECTOMY    . HERNIA REPAIR     inginual and umbilical  . renal ateral stents    . TONSILLECTOMY       OB History   None      Home Medications    Prior to Admission medications   Medication Sig Start Date End Date Taking? Authorizing Provider  acetaminophen (TYLENOL) 325 MG tablet Take 325 mg by mouth every 6 (six) hours as needed (1-2 tablets as needed).   Yes [provider]  aspirin 81 MG tablet Take 81 mg by mouth daily.   Yes [provider]  atorvastatin (LIPITOR) 20 MG tablet Take 20 mg by mouth daily.   Yes [provider]  CARTIA XT 240 MG 24 hr capsule Take 240 mg by  mouth daily. 04/24/18  Yes [provider]  cholecalciferol (VITAMIN D) 1000 units tablet Take 1,000 Units by mouth daily.   Yes [provider]  fluticasone (FLONASE) 50 MCG/ACT nasal spray Place 1 spray into both nostrils daily as needed for allergies or rhinitis.   Yes [provider]  furosemide (LASIX) 20 MG tablet Take 20 mg by mouth daily.    Yes [provider]  losartan (COZAAR) 100 MG tablet Take 100 mg by mouth daily.   Yes [provider]  montelukast (SINGULAIR) 10 MG tablet Take 10 mg by mouth at bedtime.   Yes [provider]  Multiple Vitamins-Minerals (MULTIVITAMIN WITH MINERALS) tablet Take 1 tablet by mouth daily.   Yes [provider]  omeprazole (PRILOSEC) 20 MG capsule Take 20 mg by mouth 2 (two) times daily before a meal.   Yes [provider]  ondansetron (ZOFRAN) 4 MG tablet Take 4 mg by mouth every 8 (eight) hours as needed for nausea or vomiting.   Yes [provider]  polyethylene glycol (MIRALAX / GLYCOLAX) packet Take 17 g by mouth daily as needed for mild constipation.    Yes [provider]  potassium chloride (K-DUR,KLOR-CON) 10 MEQ  tablet Take 10 mEq by mouth once.   Yes [provider]  rOPINIRole (REQUIP) 1 MG tablet Take 1 mg by mouth 4 (four) times daily.    Yes [provider]  triamcinolone ointment (KENALOG) 0.1 % Apply 1 application topically 2 (two) times daily as needed.   Yes [provider]  Lidocaine 4 % PTCH Apply 1 patch topically 2 (two) times daily. 07/16/18   Derwood Kaplan, MD    Family History No family history on file.  Social History Social History   Tobacco Use  . Smoking status: Never Smoker  . Smokeless tobacco: Never Used  Substance Use Topics  . Alcohol use: No  . Drug use: No     Allergies   Patient has no known allergies.   Review of Systems Review of Systems  Constitutional: Positive for activity  change.  Respiratory: Negative for shortness of breath.   Cardiovascular: Negative for chest pain.  Musculoskeletal: Positive for neck pain and neck stiffness. Negative for back pain.  Neurological: Negative for dizziness, syncope, facial asymmetry, speech difficulty, weakness, light-headedness, numbness and headaches.  Hematological: Does not bruise/bleed easily.     Physical Exam Updated Vital Signs BP (!) 162/52 (BP Location: Right Arm)   Pulse (!) 58   Temp 97.6 F (36.4 C) (Oral)   Resp 16   SpO2 97%   Physical Exam  Constitutional: She is oriented to person, place, and time. She appears well-developed.  HENT:  Head: Normocephalic and atraumatic.  Eyes: EOM are normal.  Neck: Normal range of motion. Neck supple.  No bruit appreciated  Cardiovascular: Normal rate.  Pulmonary/Chest: Effort normal.  Abdominal: Bowel sounds are normal.  Musculoskeletal:  Patient has midline C-spine tenderness over the lower cervical spine.  She is also having paraspinal tenderness over the left side.  Neurological: She is alert and oriented to person, place, and time. No cranial nerve deficit. Coordination normal.  Cerebellar exam reveals no dysmetria  Skin: Skin is warm and dry.  Nursing note and vitals reviewed.   ED Treatments / Results  Labs (all labs ordered are listed, but only abnormal results are displayed) Labs Reviewed - No data to display  EKG None  Radiology Ct Cervical Spine Wo Contrast  Result Date: 07/16/2018 CLINICAL DATA:  84 y/o F; 1 day of neck pain. History of neck fracture. Initial exam. EXAM: CT CERVICAL SPINE WITHOUT CONTRAST TECHNIQUE: Multidetector CT imaging of the cervical spine was performed without intravenous contrast. Multiplanar CT image reconstructions were also generated. COMPARISON:  None. FINDINGS: Alignment: C2-3, C3-4, T1-2, T2-3 grade 1 anterolisthesis prominent facet arthropathy. Reversal of cervical curvature with apex at C4. Skull base and  vertebrae: Short base fusion of the C5 and C6 vertebral bodies as well as facets compatible with Klippel-Feil deformity. No acute osseous abnormality. Soft tissues and spinal canal: No prevertebral fluid or swelling. No visible canal hematoma. Disc levels: Discogenic degenerative changes greatest at the C3-4 and C4-5 levels with there is loss of intervertebral disc space height and prominent calcified disc osteophyte complexes. Disc and facet disease results in moderate canal stenosis at C3-4 and C4-5. Uncovertebral and facet hypertrophy results in left-sided C2-3, bilateral C3-4, bilateral C4-5, bilateral C6-7 bony foraminal stenosis. Upper chest: Calcified pleural plaques of the lung apices. Other: Aortic calcific atherosclerosis. Calcific atherosclerosis of carotid bifurcations. IMPRESSION: 1. No acute fracture or dislocation identified. 2. Advanced cervical spondylosis greatest at the C3-4 and C4-5 levels where there is moderate canal stenosis. 3. 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 4. 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   On: 07/16/2018 00:04    Procedures Procedures (including critical care time)  Medications Ordered in ED Medications  lidocaine (LIDODERM) 5 % 1 patch (1 patch Transdermal Patch Applied 07/16/18 0028)  acetaminophen (TYLENOL) tablet 650 mg (650 mg Oral Given 07/16/18 0027)  ibuprofen (ADVIL,MOTRIN) tablet 400 mg (400 mg Oral Given 07/16/18 0027)  acetaminophen (TYLENOL) tablet 650 mg (650 mg Oral Given 07/16/18 0129)     Initial Impression / Assessment and Plan / ED Course  I have reviewed the triage vital signs and the nursing notes.  Pertinent labs & imaging results that were available during my care of the patient were reviewed by me and considered in my medical decision making (see chart for details).     84 year old female comes in with chief  complaint of neck pain.  She is relatively healthy and has history of cervical spine fracture and stenosis.  Patient is having neck pain that has worsened since she woke up from her nap.  She has significant tenderness with reduced range of motion because of pain.  She has no focal neurologic deficits and there are no red flags on history suggesting acute neurovascular process.  We considered vertebral artery dissection, aneurysm in the differential diagnosis -however without any acute neurological process and normal heart rate and blood pressure, suspicion for this condition is lower.  We will get a cervical spine CT scan.  Suspicion right now is that the symptoms are likely because of a compression fracture of the cervical spine or worsening degenerative disc disease.  Reassessment: Results from the ER workup discussed with the patient face to face and all questions answered to the best of my ability. Patient will be given Tylenol along with lidocaine patch.  Her family member is a spine surgery, therefore a copy of the imaging has also been provided. Strict ER return precautions have been discussed with the patient and patient's daughter.  She will return to the ER if she starts developing any neurologic symptoms or worsening of her neck pain.  Final Clinical Impressions(s) / ED Diagnoses   Final diagnoses:  Degenerative disc disease, cervical    ED Discharge Orders         Ordered    Lidocaine 4 % PTCH  2 times daily     07/16/18 0045           Derwood Kaplan, MD 07/16/18 [id number]

## 2018-07-16 NOTE — Discharge Instructions (Addendum)
We signed the ER for severe neck pain.  It appears to [place] that your symptoms are because of worsening degenerative disc disease of your neck.  The CT scan does not show any fractures. Take the medications prescribed and follow-up with the neurosurgeon as requested.  Return to the ER immediately if you start having worsening pain, any neurologic symptoms such as numbness, tingling, weakness, vision changes, dizziness or SLURRED SPEECH.

## 2020-04-06 ENCOUNTER — Encounter (HOSPITAL_COMMUNITY): Payer: Self-pay | Admitting: Radiology

## 2020-04-06 ENCOUNTER — Other Ambulatory Visit: Payer: Self-pay

## 2020-04-06 ENCOUNTER — Emergency Department (HOSPITAL_COMMUNITY): Payer: Medicare Other

## 2020-04-06 ENCOUNTER — Emergency Department (HOSPITAL_COMMUNITY)
Admission: EM | Admit: 2020-04-06 | Discharge: 2020-04-06 | Disposition: A | Discharge status: Home / Self Care | Payer: Medicare Other | Attending: Emergency Medicine | Admitting: Emergency Medicine

## 2020-04-06 DIAGNOSIS — W19XXXA Unspecified fall, initial encounter: Secondary | ICD-10-CM

## 2020-04-06 DIAGNOSIS — Z7982 Long term (current) use of aspirin: Secondary | ICD-10-CM | POA: Insufficient documentation

## 2020-04-06 DIAGNOSIS — I10 Essential (primary) hypertension: Secondary | ICD-10-CM | POA: Insufficient documentation

## 2020-04-06 DIAGNOSIS — S8991XA Unspecified injury of right lower leg, initial encounter: Secondary | ICD-10-CM | POA: Diagnosis present

## 2020-04-06 DIAGNOSIS — R519 Headache, unspecified: Secondary | ICD-10-CM | POA: Insufficient documentation

## 2020-04-06 DIAGNOSIS — W010XXA Fall on same level from slipping, tripping and stumbling without subsequent striking against object, initial encounter: Secondary | ICD-10-CM | POA: Insufficient documentation

## 2020-04-06 DIAGNOSIS — Z79899 Other long term (current) drug therapy: Secondary | ICD-10-CM | POA: Diagnosis not present

## 2020-04-06 DIAGNOSIS — S81811A Laceration without foreign body, right lower leg, initial encounter: Secondary | ICD-10-CM

## 2020-04-06 DIAGNOSIS — Y9389 Activity, other specified: Secondary | ICD-10-CM | POA: Diagnosis not present

## 2020-04-06 DIAGNOSIS — M542 Cervicalgia: Secondary | ICD-10-CM | POA: Diagnosis not present

## 2020-04-06 DIAGNOSIS — Y999 Unspecified external cause status: Secondary | ICD-10-CM | POA: Insufficient documentation

## 2020-04-06 DIAGNOSIS — Y929 Unspecified place or not applicable: Secondary | ICD-10-CM | POA: Diagnosis not present

## 2020-04-06 NOTE — ED Triage Notes (Signed)
Pt BIBA from [facility] independent living-  Per EMS- Pt reports unwitnessed fall, fell to knees then somehow landed on her back. Denies LOC, denies blood thinners.    Denies pain, skin tear present on right leg, arrives with wound wrapped.    AOx4, ambulatory at baseline- uses rollator.   CBG 125

## 2020-04-06 NOTE — Discharge Instructions (Addendum)
As discussed, your evaluation today has been largely reassuring.  But, it is important that you monitor your condition carefully, and do not hesitate to return to the ED if you develop new, or concerning changes in your condition. ? ?Otherwise, please follow-up with your physician for appropriate ongoing care. ? ?

## 2020-04-06 NOTE — ED Provider Notes (Signed)
[facility]-EMERGENCY DEPT Provider Note   CSN: [id number] Arrival date & time: 04/06/20  1543     History Chief Complaint  Patient presents with  . Fall    Holly Mann is a 86 y.o. female.  HPI   Elderly female presents from home after a fall.  Initially patient may have had pain in her head, neck, but currently states that she feels otherwise fine. She notes that she was doing laundry, slipped, fell, striking the back of her head, neck.  She denies loss of consciousness, no weakness in any extremity, no vomiting, no vision changes. Patient states that she is generally well, though she notes that she has a history of cervical spine fracture, following prior fall. She does not take blood thinning medication.  Past Medical History:  Diagnosis Date  . C2 cervical fracture (HCC)   . Hiatal hernia   . Hypertension   . Spinal stenosis     There are no problems to display for this patient.   Past Surgical History:  Procedure Laterality Date  . APPENDECTOMY    . ARTHROPLASTY     bilateral knee  . BACK SURGERY    . CAROTID ENDARTERECTOMY     bilateral  . CHOLECYSTECTOMY    . HERNIA REPAIR     inginual and umbilical  . renal ateral stents    . TONSILLECTOMY       OB History   No obstetric history on file.     No family history on file.  Social History   Tobacco Use  . Smoking status: Never Smoker  . Smokeless tobacco: Never Used  Substance Use Topics  . Alcohol use: No  . Drug use: No    Home Medications Prior to Admission medications   Medication Sig Start Date End Date Taking? Authorizing Provider  acetaminophen (TYLENOL) 325 MG tablet Take 325 mg by mouth every 6 (six) hours as needed (1-2 tablets as needed).    [provider]  aspirin 81 MG tablet Take 81 mg by mouth daily.    [provider]  atorvastatin (LIPITOR) 20 MG tablet Take 20 mg by mouth daily.    [provider]  CARTIA XT 240 MG 24 hr  capsule Take 240 mg by mouth daily. 04/24/18   [provider]  cholecalciferol (VITAMIN D) 1000 units tablet Take 1,000 Units by mouth daily.    [provider]  fluticasone (FLONASE) 50 MCG/ACT nasal spray Place 1 spray into both nostrils daily as needed for allergies or rhinitis.    [provider]  furosemide (LASIX) 20 MG tablet Take 20 mg by mouth daily.     [provider]  Lidocaine 4 % PTCH Apply 1 patch topically 2 (two) times daily. 07/16/18   Derwood Kaplan, MD  losartan (COZAAR) 100 MG tablet Take 100 mg by mouth daily.    [provider]  montelukast (SINGULAIR) 10 MG tablet Take 10 mg by mouth at bedtime.    [provider]  Multiple Vitamins-Minerals (MULTIVITAMIN WITH MINERALS) tablet Take 1 tablet by mouth daily.    [provider]  omeprazole (PRILOSEC) 20 MG capsule Take 20 mg by mouth 2 (two) times daily before a meal.    [provider]  ondansetron (ZOFRAN) 4 MG tablet Take 4 mg by mouth every 8 (eight) hours as needed for nausea or vomiting.    [provider]  polyethylene glycol (MIRALAX / GLYCOLAX) packet Take 17 g by mouth  daily as needed for mild constipation.     [provider]  potassium chloride (K-DUR,KLOR-CON) 10 MEQ tablet Take 10 mEq by mouth once.    [provider]  rOPINIRole (REQUIP) 1 MG tablet Take 1 mg by mouth 4 (four) times daily.     [provider]  triamcinolone ointment (KENALOG) 0.1 % Apply 1 application topically 2 (two) times daily as needed.    [provider]    Allergies    Patient has no known allergies.  Review of Systems   Review of Systems  Constitutional:       Per HPI, otherwise negative  HENT:       Per HPI, otherwise negative  Respiratory:       Per HPI, otherwise negative  Cardiovascular:       Per HPI, otherwise negative  Gastrointestinal: Negative for vomiting.  Endocrine:       Negative aside from HPI    Genitourinary:       Neg aside from HPI   Musculoskeletal:       Per HPI, otherwise negative  Skin: Positive for wound.  Neurological: Negative for syncope.    Physical Exam Updated Vital Signs BP (!) 145/57 (BP Location: Right Arm)   Pulse 64   Temp 97.7 F (36.5 C) (Oral)   Resp 18   Ht 4\' 10"  (1.473 m)   Wt 52 kg   SpO2 95%   BMI 23.96 kg/m   Physical Exam Vitals and nursing note reviewed.  Constitutional:      General: She is not in acute distress.    Appearance: She is well-developed. She is not ill-appearing or diaphoretic.  HENT:     Head: Normocephalic and atraumatic.  Eyes:     Conjunctiva/sclera: Conjunctivae normal.  Cardiovascular:     Rate and Rhythm: Normal rate and regular rhythm.     Heart sounds: Murmur heard.   Pulmonary:     Effort: Pulmonary effort is normal. No respiratory distress.     Breath sounds: Normal breath sounds. No stridor.  Abdominal:     General: There is no distension.  Musculoskeletal:        General: Signs of injury present. No deformity.  Skin:    General: Skin is warm and dry.       Neurological:     Mental Status: She is alert and oriented to person, place, and time.     Cranial Nerves: No cranial nerve deficit.     Motor: Atrophy present. No tremor.     Coordination: Coordination normal.     Comments: Slightly slowed responses, but speech is clear, brief, appropriate.  No facial asymmetry. Diffuse atrophy, but the patient does move all extremities spontaneously and to command.  Psychiatric:        Behavior: Behavior is slowed.      ED Results / Procedures / Treatments   Labs (all labs ordered are listed, but only abnormal results are displayed) Labs Reviewed - No data to display   Radiology CT Head Wo Contrast  Result Date: 04/06/2020 CLINICAL DATA:  Poly trauma, fall, head injury EXAM: CT HEAD WITHOUT CONTRAST TECHNIQUE: Contiguous axial images were obtained from the base of the skull through the vertex  without intravenous contrast. COMPARISON:  None. FINDINGS: Brain: There is normal anatomic configuration of the brain. 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 No evidence of acute intracranial hemorrhage or infarct. Mild parenchymal volume loss is commensurate  with the patient's age. No abnormal intra or extra-axial mass lesion or fluid collection. No abnormal mass effect or midline shift. Ventricular size is normal. Cerebellum unremarkable. Vascular: Mild atherosclerotic calcification within the carotid siphons. No hyperdense vessel identified. Skull: Intact Sinuses/Orbits: There is complete opacification of the left maxillary sinus and several left ethmoid air cells. Calcification centrally within this region suggests chronic occlusion. The remaining paranasal sinuses are clear. The orbits are unremarkable. Other: Small right frontal scalp hematoma is noted toward the vertex. Mastoid air cells and middle ear cavities are clear. IMPRESSION: Small frontal scalp hematoma. No calvarial fracture. No acute intracranial injury. Electronically Signed   By: Helyn Numbers MD   On: 04/06/2020 17:10   CT Cervical Spine Wo Contrast  Result Date: 04/06/2020 CLINICAL DATA:  Un witnessed fall, chronic remote C2 fracture EXAM: CT CERVICAL SPINE WITHOUT CONTRAST TECHNIQUE: Multidetector CT imaging of the cervical spine was performed without intravenous contrast. Multiplanar CT image reconstructions were also generated. 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 Stable mild anterolisthesis of C2 on C3, C3 on C4, and T1 on T2. Overall stable appearance. Skull base and vertebrae: Since the prior exam, there is a 3 part fracture of the C1 vertebral body. Displaced fracture through the anterior arch, with bilateral laminar fractures. The fracture lines appear well corticated compatible with sequela  of chronic injury. Prior healed odontoid fracture is noted, with no change in alignment. There are no other acute displaced fractures. Soft tissues and spinal canal: No prevertebral fluid or swelling. No visible canal hematoma. Stable asymmetric atherosclerosis of the left carotid bulb. Disc levels: There is stable multilevel cervical spondylosis most pronounced at C4/C5. Upper chest: Central airway is patent. Chronic scarring at the lung apices. Other: Reconstructed images demonstrate no additional findings. IMPRESSION: 1. Interval development of a multipart C1 fracture involving the anterior arch and bilateral lamina as above. The fracture does not appear acute, but has developed since 2019. 2. Chronic healed odontoid fracture. 3. No other acute bony abnormalities. 4. Stable multilevel cervical spondylosis greatest at C4/C5. Electronically Signed   By: Sharlet Salina M.D.   On: 04/06/2020 17:10    Procedures Procedures (including critical care time)  Medications Ordered in ED Medications - No data to display  ED Course  I have reviewed the triage vital signs and the nursing notes.  Pertinent labs & imaging results that were available during my care of the patient were reviewed by me and considered in my medical decision making (see chart for details).    5:50 PM Patient accompanied by her daughter.  We discussed the fall, patient's living situation, and daughter notes that the patient is moving to [place] to move into an assisted living facility in 4 days. Daughter states that the patient is interacting in a typical manner for her.  Patient remains in no distress, denies any ongoing plaints. We discussed CT results, notable for no fracture, acute.  There is persistent demonstration of corticated cervical spine fracture, consistent with patient's description of prior episode. No evidence for new fracture, no new neurologic dysfunction, no evidence for intracranial hemorrhage, no distress, patient is  appropriate for discharge, wound care instructions provided.  MDM Rules/Calculators/A&P MDM Number of Diagnoses or Management Options Fall, initial encounter: new, needed workup Noninfected skin tear of right lower extremity, initial encounter: minor   Amount and/or Complexity of Data Reviewed Tests in the radiology section of CPT: reviewed  Decide to obtain previous medical records or to obtain history from someone other than the patient: yes Obtain history from someone other than the patient: yes Independent visualization of images, tracings, or specimens: yes  Risk of Complications, Morbidity, and/or Mortality Presenting problems: high Diagnostic procedures: high Management options: high  Critical Care Total time providing critical care: < 30 minutes  Patient Progress Patient progress: stable  Final Clinical Impression(s) / ED Diagnoses Final diagnoses:  Fall, initial encounter  Noninfected skin tear of right lower extremity, initial encounter     Gerhard MunchLockwood, Ioanna Colquhoun, MD 04/06/20 1752

## 2021-03-29 DEATH — deceased
